# Patient Record
Sex: Male | Born: 1965 | Race: White | Hispanic: No | Marital: Married | Smoking: Never smoker
Health system: Southern US, Community
[De-identification: ages and names within clinical notes are randomized; demographics above are authoritative.]

## PROBLEM LIST (undated history)

## (undated) DIAGNOSIS — N200 Calculus of kidney: Secondary | ICD-10-CM

## (undated) DIAGNOSIS — E785 Hyperlipidemia, unspecified: Secondary | ICD-10-CM

## (undated) HISTORY — PX: KNEE SURGERY: SHX244

## (undated) HISTORY — PX: KIDNEY STONE SURGERY: SHX686

## (undated) HISTORY — PX: COLONOSCOPY: SHX174

---

## 2016-09-02 DIAGNOSIS — Z85828 Personal history of other malignant neoplasm of skin: Secondary | ICD-10-CM | POA: Diagnosis not present

## 2016-09-02 DIAGNOSIS — L820 Inflamed seborrheic keratosis: Secondary | ICD-10-CM | POA: Diagnosis not present

## 2016-11-02 DIAGNOSIS — J0140 Acute pansinusitis, unspecified: Secondary | ICD-10-CM | POA: Diagnosis not present

## 2016-11-16 DIAGNOSIS — M25561 Pain in right knee: Secondary | ICD-10-CM | POA: Diagnosis not present

## 2016-11-16 DIAGNOSIS — M2391 Unspecified internal derangement of right knee: Secondary | ICD-10-CM | POA: Diagnosis not present

## 2017-03-02 DIAGNOSIS — M2391 Unspecified internal derangement of right knee: Secondary | ICD-10-CM | POA: Diagnosis not present

## 2017-03-22 DIAGNOSIS — M545 Low back pain: Secondary | ICD-10-CM | POA: Diagnosis not present

## 2017-03-22 DIAGNOSIS — Z0000 Encounter for general adult medical examination without abnormal findings: Secondary | ICD-10-CM | POA: Diagnosis not present

## 2017-03-22 DIAGNOSIS — E7800 Pure hypercholesterolemia, unspecified: Secondary | ICD-10-CM | POA: Diagnosis not present

## 2017-03-28 DIAGNOSIS — E7800 Pure hypercholesterolemia, unspecified: Secondary | ICD-10-CM | POA: Diagnosis not present

## 2017-03-28 DIAGNOSIS — Z1211 Encounter for screening for malignant neoplasm of colon: Secondary | ICD-10-CM | POA: Diagnosis not present

## 2017-03-28 DIAGNOSIS — Z0000 Encounter for general adult medical examination without abnormal findings: Secondary | ICD-10-CM | POA: Diagnosis not present

## 2017-04-06 DIAGNOSIS — L728 Other follicular cysts of the skin and subcutaneous tissue: Secondary | ICD-10-CM | POA: Diagnosis not present

## 2017-07-24 ENCOUNTER — Ambulatory Visit
Admission: EM | Admit: 2017-07-24 | Discharge: 2017-07-24 | Disposition: A | Discharge status: Home / Self Care | Payer: 59 | Attending: Family Medicine | Admitting: Family Medicine

## 2017-07-24 ENCOUNTER — Encounter: Payer: Self-pay | Admitting: Gynecology

## 2017-07-24 ENCOUNTER — Other Ambulatory Visit: Payer: Self-pay

## 2017-07-24 DIAGNOSIS — J069 Acute upper respiratory infection, unspecified: Secondary | ICD-10-CM

## 2017-07-24 DIAGNOSIS — R05 Cough: Secondary | ICD-10-CM | POA: Diagnosis not present

## 2017-07-24 DIAGNOSIS — B9789 Other viral agents as the cause of diseases classified elsewhere: Secondary | ICD-10-CM | POA: Diagnosis not present

## 2017-07-24 MED ORDER — HYDROCOD POLST-CPM POLST ER 10-8 MG/5ML PO SUER: 5.0000 mL | Freq: Two times a day (BID) | ORAL | 0 refills | Status: DC | PRN

## 2017-07-24 NOTE — ED Triage Notes (Signed)
Patient c/o sinus problem x 3 days.

## 2017-07-24 NOTE — ED Provider Notes (Signed)
[facility]    CSN: [id number] Arrival date & time: 07/24/17  1194     History   Chief Complaint Chief Complaint  Patient presents with  . Sinusitis    HPI James Mcdowell is a 51 y.o. male.   The history is provided by the patient.  URI  Presenting symptoms: congestion, cough and rhinorrhea   Severity:  Moderate Onset quality:  Sudden Duration:  3 days Timing:  Constant Progression:  Worsening Chronicity:  New Relieved by:  OTC medications Associated symptoms: sinus pain   Associated symptoms: no wheezing   Risk factors: sick contacts   Risk factors: not elderly, no chronic cardiac disease, no chronic kidney disease, no chronic respiratory disease, no diabetes mellitus, no immunosuppression, no recent illness and no recent travel     History reviewed. No pertinent past medical history.  There are no active problems to display for this patient.   Past Surgical History:  Procedure Laterality Date  . KNEE SURGERY Left        Home Medications    Prior to Admission medications   Medication Sig Start Date End Date Taking? Authorizing Provider  chlorpheniramine-HYDROcodone (TUSSIONEX PENNKINETIC ER) 10-8 MG/5ML SUER Take 5 mLs by mouth every 12 (twelve) hours as needed. 07/24/17   Norval Gable, MD    Family History Family History  Problem Relation Age of Onset  . Diabetes Father   . Heart failure Father   . Kidney failure Father     Social History Social History   Tobacco Use  . Smoking status: Never Smoker  . Smokeless tobacco: Never Used  Substance Use Topics  . Alcohol use: No    Frequency: Never  . Drug use: No     Allergies   Patient has no allergy information on record.   Review of Systems Review of Systems  HENT: Positive for congestion, rhinorrhea and sinus pain.   Respiratory: Positive for cough. Negative for wheezing.      Physical Exam Triage Vital Signs ED Triage Vitals  Enc Vitals Group     BP 07/24/17  0842 124/75     Pulse Rate 07/24/17 0842 69     Resp 07/24/17 0842 16     Temp 07/24/17 0842 98.3 F (36.8 C)     Temp Source 07/24/17 0842 Oral     SpO2 07/24/17 0842 98 %     Weight 07/24/17 0841 290 lb (131.5 kg)     Height 07/24/17 0841 6\' 3"  (1.905 m)     Head Circumference --      Peak Flow --      Pain Score 07/24/17 0844 2     Pain Loc --      Pain Edu? --      Excl. in [place]? --    No data found.  Updated Vital Signs BP 124/75 (BP Location: Left Arm)   Pulse 69   Temp 98.3 F (36.8 C) (Oral)   Resp 16   Ht 6\' 3"  (1.905 m)   Wt 290 lb (131.5 kg)   SpO2 98%   BMI 36.25 kg/m   Visual Acuity Right Eye Distance:   Left Eye Distance:   Bilateral Distance:    Right Eye Near:   Left Eye Near:    Bilateral Near:     Physical Exam  Constitutional: He appears well-developed and well-nourished. No distress.  HENT:  Head: Normocephalic and atraumatic.  Right Ear: Tympanic membrane, external ear and ear canal normal.  Left  Ear: Tympanic membrane, external ear and ear canal normal.  Nose: Nose normal.  Mouth/Throat: Uvula is midline, oropharynx is clear and moist and mucous membranes are normal. No oropharyngeal exudate or tonsillar abscesses.  Eyes: Conjunctivae and EOM are normal. Pupils are equal, round, and reactive to light. Right eye exhibits no discharge. Left eye exhibits no discharge. No scleral icterus.  Neck: Normal range of motion. Neck supple. No tracheal deviation present. No thyromegaly present.  Cardiovascular: Normal rate, regular rhythm and normal heart sounds.  Pulmonary/Chest: Effort normal and breath sounds normal. No stridor. No respiratory distress. He has no wheezes. He has no rales. He exhibits no tenderness.  Lymphadenopathy:    He has no cervical adenopathy.  Neurological: He is alert.  Skin: Skin is warm and dry. No rash noted. He is not diaphoretic.  Nursing note and vitals reviewed.    UC Treatments / Results  Labs (all labs ordered  are listed, but only abnormal results are displayed) Labs Reviewed - No data to display  EKG  EKG Interpretation None       Radiology No results found.  Procedures Procedures (including critical care time)  Medications Ordered in UC Medications - No data to display   Initial Impression / Assessment and Plan / UC Course  I have reviewed the triage vital signs and the nursing notes.  Pertinent labs & imaging results that were available during my care of the patient were reviewed by me and considered in my medical decision making (see chart for details).       Final Clinical Impressions(s) / UC Diagnoses   Final diagnoses:  Viral URI with cough    ED Discharge Orders        Ordered    chlorpheniramine-HYDROcodone (TUSSIONEX PENNKINETIC ER) 10-8 MG/5ML SUER  Every 12 hours PRN     07/24/17 0901     1. diagnosis reviewed with patient 2. rx as per orders above; reviewed possible side effects, interactions, risks and benefits  3. Recommend supportive treatment with rest, fluids, otc analgesics 4. Follow-up prn if symptoms worsen or don't improve  Controlled Substance Prescriptions [place] Controlled Substance Registry consulted? Not Applicable   Norval Gable, MD 07/24/17 [phone]

## 2017-08-11 DIAGNOSIS — J0140 Acute pansinusitis, unspecified: Secondary | ICD-10-CM | POA: Diagnosis not present

## 2017-09-01 DIAGNOSIS — Z85828 Personal history of other malignant neoplasm of skin: Secondary | ICD-10-CM | POA: Diagnosis not present

## 2017-09-01 DIAGNOSIS — L570 Actinic keratosis: Secondary | ICD-10-CM | POA: Diagnosis not present

## 2017-09-06 DIAGNOSIS — J309 Allergic rhinitis, unspecified: Secondary | ICD-10-CM | POA: Diagnosis not present

## 2017-10-13 DIAGNOSIS — Z01818 Encounter for other preprocedural examination: Secondary | ICD-10-CM | POA: Diagnosis not present

## 2017-10-13 DIAGNOSIS — Z1211 Encounter for screening for malignant neoplasm of colon: Secondary | ICD-10-CM | POA: Diagnosis not present

## 2017-11-14 DIAGNOSIS — J0140 Acute pansinusitis, unspecified: Secondary | ICD-10-CM | POA: Diagnosis not present

## 2017-11-14 DIAGNOSIS — J309 Allergic rhinitis, unspecified: Secondary | ICD-10-CM | POA: Diagnosis not present

## 2017-12-16 DIAGNOSIS — K635 Polyp of colon: Secondary | ICD-10-CM | POA: Diagnosis not present

## 2017-12-16 DIAGNOSIS — K648 Other hemorrhoids: Secondary | ICD-10-CM | POA: Diagnosis not present

## 2017-12-16 DIAGNOSIS — Z1211 Encounter for screening for malignant neoplasm of colon: Secondary | ICD-10-CM | POA: Diagnosis not present

## 2017-12-16 DIAGNOSIS — D126 Benign neoplasm of colon, unspecified: Secondary | ICD-10-CM | POA: Diagnosis not present

## 2017-12-16 DIAGNOSIS — K640 First degree hemorrhoids: Secondary | ICD-10-CM | POA: Diagnosis not present

## 2017-12-26 DIAGNOSIS — J324 Chronic pansinusitis: Secondary | ICD-10-CM | POA: Diagnosis not present

## 2017-12-26 DIAGNOSIS — J45991 Cough variant asthma: Secondary | ICD-10-CM | POA: Diagnosis not present

## 2018-01-10 DIAGNOSIS — J309 Allergic rhinitis, unspecified: Secondary | ICD-10-CM | POA: Diagnosis not present

## 2018-01-10 DIAGNOSIS — J320 Chronic maxillary sinusitis: Secondary | ICD-10-CM | POA: Diagnosis not present

## 2018-03-22 DIAGNOSIS — Z0000 Encounter for general adult medical examination without abnormal findings: Secondary | ICD-10-CM | POA: Diagnosis not present

## 2018-03-22 DIAGNOSIS — E7800 Pure hypercholesterolemia, unspecified: Secondary | ICD-10-CM | POA: Diagnosis not present

## 2018-03-22 DIAGNOSIS — Z1211 Encounter for screening for malignant neoplasm of colon: Secondary | ICD-10-CM | POA: Diagnosis not present

## 2018-03-29 ENCOUNTER — Other Ambulatory Visit: Payer: Self-pay | Admitting: Internal Medicine

## 2018-03-29 DIAGNOSIS — Z0000 Encounter for general adult medical examination without abnormal findings: Secondary | ICD-10-CM | POA: Diagnosis not present

## 2018-03-29 DIAGNOSIS — E7800 Pure hypercholesterolemia, unspecified: Secondary | ICD-10-CM | POA: Diagnosis not present

## 2018-03-29 DIAGNOSIS — R600 Localized edema: Secondary | ICD-10-CM

## 2018-03-30 ENCOUNTER — Ambulatory Visit
Admission: RE | Admit: 2018-03-30 | Discharge: 2018-03-30 | Disposition: A | Discharge status: Home / Self Care | Payer: 59 | Source: Ambulatory Visit | Attending: Internal Medicine | Admitting: Internal Medicine

## 2018-03-30 DIAGNOSIS — R600 Localized edema: Secondary | ICD-10-CM

## 2018-06-12 DIAGNOSIS — Z23 Encounter for immunization: Secondary | ICD-10-CM | POA: Diagnosis not present

## 2018-06-15 DIAGNOSIS — J019 Acute sinusitis, unspecified: Secondary | ICD-10-CM | POA: Diagnosis not present

## 2018-06-15 DIAGNOSIS — J4520 Mild intermittent asthma, uncomplicated: Secondary | ICD-10-CM | POA: Diagnosis not present

## 2018-06-15 DIAGNOSIS — J309 Allergic rhinitis, unspecified: Secondary | ICD-10-CM | POA: Diagnosis not present

## 2018-06-29 DIAGNOSIS — J320 Chronic maxillary sinusitis: Secondary | ICD-10-CM | POA: Diagnosis not present

## 2018-06-29 DIAGNOSIS — J3489 Other specified disorders of nose and nasal sinuses: Secondary | ICD-10-CM | POA: Diagnosis not present

## 2018-07-20 DIAGNOSIS — D2262 Melanocytic nevi of left upper limb, including shoulder: Secondary | ICD-10-CM | POA: Diagnosis not present

## 2018-07-20 DIAGNOSIS — Z85828 Personal history of other malignant neoplasm of skin: Secondary | ICD-10-CM | POA: Diagnosis not present

## 2018-07-20 DIAGNOSIS — X32XXXA Exposure to sunlight, initial encounter: Secondary | ICD-10-CM | POA: Diagnosis not present

## 2018-07-20 DIAGNOSIS — L570 Actinic keratosis: Secondary | ICD-10-CM | POA: Diagnosis not present

## 2018-07-20 DIAGNOSIS — D2261 Melanocytic nevi of right upper limb, including shoulder: Secondary | ICD-10-CM | POA: Diagnosis not present

## 2018-07-25 ENCOUNTER — Other Ambulatory Visit: Payer: Self-pay

## 2018-07-25 ENCOUNTER — Encounter: Payer: Self-pay | Admitting: *Deleted

## 2018-08-04 ENCOUNTER — Encounter
Admission: RE | Disposition: A | Discharge status: Home / Self Care | Payer: Self-pay | Source: Home / Self Care | Attending: Unknown Physician Specialty

## 2018-08-04 ENCOUNTER — Ambulatory Visit
Admission: RE | Admit: 2018-08-04 | Discharge: 2018-08-04 | Disposition: A | Discharge status: Home / Self Care | Payer: 59 | Attending: Unknown Physician Specialty | Admitting: Unknown Physician Specialty

## 2018-08-04 ENCOUNTER — Ambulatory Visit: Payer: 59 | Admitting: Anesthesiology

## 2018-08-04 DIAGNOSIS — J324 Chronic pansinusitis: Secondary | ICD-10-CM | POA: Diagnosis not present

## 2018-08-04 DIAGNOSIS — J342 Deviated nasal septum: Secondary | ICD-10-CM | POA: Insufficient documentation

## 2018-08-04 DIAGNOSIS — J320 Chronic maxillary sinusitis: Secondary | ICD-10-CM | POA: Diagnosis not present

## 2018-08-04 DIAGNOSIS — J3489 Other specified disorders of nose and nasal sinuses: Secondary | ICD-10-CM | POA: Insufficient documentation

## 2018-08-04 DIAGNOSIS — J343 Hypertrophy of nasal turbinates: Secondary | ICD-10-CM | POA: Diagnosis not present

## 2018-08-04 HISTORY — PX: IMAGE GUIDED SINUS SURGERY: SHX6570

## 2018-08-04 HISTORY — PX: SEPTOPLASTY: SHX2393

## 2018-08-04 HISTORY — DX: Calculus of kidney: N20.0

## 2018-08-04 HISTORY — DX: Hyperlipidemia, unspecified: E78.5

## 2018-08-04 HISTORY — PX: MAXILLARY ANTROSTOMY: SHX2003

## 2018-08-04 HISTORY — PX: TURBINATE REDUCTION: SHX6157

## 2018-08-04 SURGERY — SINUS SURGERY, WITH IMAGING GUIDANCE
Anesthesia: General

## 2018-08-04 MED ORDER — OXYCODONE HCL 5 MG/5ML PO SOLN: 5.0000 mg | Freq: Once | ORAL | Status: AC | PRN

## 2018-08-04 MED ORDER — OXYMETAZOLINE HCL 0.05 % NA SOLN
6.0000 | Freq: Once | NASAL | Status: AC
  Administered 2018-08-04: 6 via NASAL

## 2018-08-04 MED ORDER — MIDAZOLAM HCL 5 MG/5ML IJ SOLN
INTRAMUSCULAR | Status: DC | PRN
  Administered 2018-08-04: 2 mg via INTRAVENOUS

## 2018-08-04 MED ORDER — PHENYLEPHRINE HCL 0.5 % NA SOLN
NASAL | Status: DC | PRN
  Administered 2018-08-04: 15 mL via NASAL

## 2018-08-04 MED ORDER — PROPOFOL 10 MG/ML IV BOLUS
INTRAVENOUS | Status: DC | PRN
  Administered 2018-08-04: 200 mg via INTRAVENOUS

## 2018-08-04 MED ORDER — ACETAMINOPHEN 10 MG/ML IV SOLN
1000.0000 mg | Freq: Once | INTRAVENOUS | Status: AC
  Administered 2018-08-04: 1000 mg via INTRAVENOUS

## 2018-08-04 MED ORDER — LIDOCAINE-EPINEPHRINE 1 %-1:100000 IJ SOLN
INTRAMUSCULAR | Status: DC | PRN
  Administered 2018-08-04: 18 mL

## 2018-08-04 MED ORDER — SULFAMETHOXAZOLE-TRIMETHOPRIM 800-160 MG PO TABS: 1.0000 | ORAL_TABLET | Freq: Two times a day (BID) | ORAL | 0 refills | Status: AC

## 2018-08-04 MED ORDER — SCOPOLAMINE 1 MG/3DAYS TD PT72
1.0000 | MEDICATED_PATCH | Freq: Once | TRANSDERMAL | Status: DC
  Administered 2018-08-04: 1.5 mg via TRANSDERMAL

## 2018-08-04 MED ORDER — FENTANYL CITRATE (PF) 100 MCG/2ML IJ SOLN: 25.0000 ug | INTRAMUSCULAR | Status: DC | PRN

## 2018-08-04 MED ORDER — LIDOCAINE HCL (CARDIAC) PF 100 MG/5ML IV SOSY
PREFILLED_SYRINGE | INTRAVENOUS | Status: DC | PRN
  Administered 2018-08-04: 40 mg via INTRAVENOUS

## 2018-08-04 MED ORDER — ONDANSETRON HCL 4 MG/2ML IJ SOLN
INTRAMUSCULAR | Status: DC | PRN
  Administered 2018-08-04: 4 mg via INTRAVENOUS

## 2018-08-04 MED ORDER — OXYCODONE HCL 5 MG PO TABS
5.0000 mg | ORAL_TABLET | Freq: Once | ORAL | Status: AC | PRN
  Administered 2018-08-04: 5 mg via ORAL

## 2018-08-04 MED ORDER — FENTANYL CITRATE (PF) 100 MCG/2ML IJ SOLN
INTRAMUSCULAR | Status: DC | PRN
  Administered 2018-08-04: 50 ug via INTRAVENOUS
  Administered 2018-08-04 (×2): 25 ug via INTRAVENOUS

## 2018-08-04 MED ORDER — HYDROCODONE-ACETAMINOPHEN 5-300 MG PO TABS: 1.0000 | ORAL_TABLET | ORAL | 0 refills | Status: AC | PRN

## 2018-08-04 MED ORDER — LACTATED RINGERS IV SOLN
INTRAVENOUS | Status: DC
  Administered 2018-08-04: 11:00:00 via INTRAVENOUS

## 2018-08-04 MED ORDER — GLYCOPYRROLATE 0.2 MG/ML IJ SOLN
INTRAMUSCULAR | Status: DC | PRN
  Administered 2018-08-04: 0.1 mg via INTRAVENOUS

## 2018-08-04 MED ORDER — SUCCINYLCHOLINE CHLORIDE 20 MG/ML IJ SOLN
INTRAMUSCULAR | Status: DC | PRN
  Administered 2018-08-04: 100 mg via INTRAVENOUS

## 2018-08-04 MED ORDER — ONDANSETRON HCL 4 MG/2ML IJ SOLN: 4.0000 mg | Freq: Once | INTRAMUSCULAR | Status: DC | PRN

## 2018-08-04 MED ORDER — DEXAMETHASONE SODIUM PHOSPHATE 4 MG/ML IJ SOLN
INTRAMUSCULAR | Status: DC | PRN
  Administered 2018-08-04: 10 mg via INTRAVENOUS

## 2018-08-04 SURGICAL SUPPLY — 33 items
BATTERY INSTRU NAVIGATION (MISCELLANEOUS) ×9 IMPLANT
CANISTER SUCT 1200ML W/VALVE (MISCELLANEOUS) ×3 IMPLANT
COAG SUCT 10F 3.5MM HAND CTRL (MISCELLANEOUS) ×3 IMPLANT
CUP MEDICINE 2OZ PLAST GRAD ST (MISCELLANEOUS) ×3 IMPLANT
DRAPE HEAD BAR (DRAPES) ×3 IMPLANT
DRESSING NASL FOAM PST OP SINU (MISCELLANEOUS) ×4 IMPLANT
DRSG NASAL FOAM POST OP SINU (MISCELLANEOUS) ×6
ELECT REM PT RETURN 9FT ADLT (ELECTROSURGICAL) ×3
ELECTRODE REM PT RTRN 9FT ADLT (ELECTROSURGICAL) ×2 IMPLANT
GLOVE BIO SURGEON STRL SZ7.5 (GLOVE) ×6 IMPLANT
HANDLE YANKAUER SUCT BULB TIP (MISCELLANEOUS) ×3 IMPLANT
KIT TURNOVER KIT A (KITS) ×3 IMPLANT
NEEDLE HYPO 25GX1X1/2 BEV (NEEDLE) ×3 IMPLANT
NS IRRIG 500ML POUR BTL (IV SOLUTION) ×3 IMPLANT
PACK ENT CUSTOM (PACKS) ×3 IMPLANT
SOL ANTI-FOG 6CC FOG-OUT (MISCELLANEOUS) ×2 IMPLANT
SOL FOG-OUT ANTI-FOG 6CC (MISCELLANEOUS) ×1
SPLINT NASAL SEPTAL BLV .25 LG (MISCELLANEOUS) ×3 IMPLANT
SPONGE NEURO XRAY DETECT 1X3 (DISPOSABLE) ×3 IMPLANT
STRAP BODY AND KNEE 60X3 (MISCELLANEOUS) ×3 IMPLANT
SUT CHROMIC 3-0 (SUTURE) ×1
SUT CHROMIC 3-0 KS 27XMFL CR (SUTURE) ×2
SUT CHROMIC 5-0 (SUTURE)
SUT CHROMIC 5-0 P2 18XMFL CR (SUTURE)
SUT ETHILON 3-0 KS 30 BLK (SUTURE) ×3 IMPLANT
SUT PLAIN GUT 4-0 (SUTURE) IMPLANT
SUTURE CHRMC 3-0 KS 27XMFL CR (SUTURE) ×2 IMPLANT
SUTURE CHRMC 5-0 P2 18XMF CR (SUTURE) IMPLANT
SYR 10ML LL (SYRINGE) ×3 IMPLANT
TOWEL OR 17X26 4PK STRL BLUE (TOWEL DISPOSABLE) ×3 IMPLANT
TRACKER CRANIALMASK (MASK) ×3 IMPLANT
TUBING CONNECTING 10 (TUBING) ×3 IMPLANT
WATER STERILE IRR 250ML POUR (IV SOLUTION) ×3 IMPLANT

## 2018-08-04 NOTE — Discharge Instructions (Signed)
[facility] °[facility] °ENDOSCOPIC SINUS SURGERY °[facility] ° °What is Functional Endoscopic Sinus Surgery? ° The Surgery involves making the natural openings of the sinuses larger by removing the bony partitions that separate the sinuses from the nasal cavity.  The natural sinus lining is preserved as much as possible to allow the sinuses to resume normal function after the surgery.  In some patients nasal polyps (excessively swollen lining of the sinuses) may be removed to relieve obstruction of the sinus openings.  The surgery is performed through the nose using lighted scopes, which eliminates the need for incisions on the face.  A septoplasty is a different procedure which is sometimes performed with sinus surgery.  It involves straightening the boy partition that separates the two sides of your nose.  A crooked or deviated septum may need repair if is obstructing the sinuses or nasal airflow.  Turbinate reduction is also often performed during sinus surgery.  The turbinates are bony proturberances from the side walls of the nose which swell and can obstruct the nose in patients with sinus and allergy problems.  Their size can be surgically reduced to help relieve nasal obstruction. ° °What Can Sinus Surgery Do For Me? ° Sinus surgery can reduce the frequency of sinus infections requiring antibiotic treatment.  This can provide improvement in nasal congestion, post-nasal drainage, facial pressure and nasal obstruction.  Surgery will NOT prevent you from ever having an infection again, so it usually only for patients who get infections 4 or more times yearly requiring antibiotics, or for infections that do not clear with antibiotics.  It will not cure nasal allergies, so patients with allergies may still require medication to treat their allergies after surgery. Surgery may improve headaches related to sinusitis, however, some people will continue to  require medication to control sinus headaches related to allergies.  Surgery will do nothing for other forms of headache (migraine, tension or cluster). ° °What Are the Risks of Endoscopic Sinus Surgery? ° Current techniques allow surgery to be performed safely with little risk, however, there are rare complications that patients should be aware of.  Because the sinuses are located around the eyes, there is risk of eye injury, including blindness, though again, this would be quite rare. This is usually a result of bleeding behind the eye during surgery, which puts the vision oat risk, though there are treatments to protect the vision and prevent permanent disrupted by surgery causing a leak of the spinal fluid that surrounds the brain.  More serious complications would include bleeding inside the brain cavity or damage to the brain.  Again, all of these complications are uncommon, and spinal fluid leaks can be safely managed surgically if they occur.  The most common complication of sinus surgery is bleeding from the nose, which may require packing or cauterization of the nose.  Continued sinus have polyps may experience recurrence of the polyps requiring revision surgery.  Alterations of sense of smell or injury to the tear ducts are also rare complications.  ° °What is the Surgery Like, and what is the Recovery? ° The Surgery usually takes a couple of hours to perform, and is usually performed under a general anesthetic (completely asleep).  Patients are usually discharged home after a couple of hours.  Sometimes during surgery it is necessary to pack the nose to control bleeding, and the packing is left in place for 24 - 48 hours, and removed by your surgeon.    If a septoplasty was performed during the procedure, there is often a splint placed which must be removed after 5-7 days.   °Discomfort: Pain is usually mild to moderate, and can be controlled by prescription pain medication or acetaminophen (Tylenol).   Aspirin, Ibuprofen (Advil, Motrin), or Naprosyn (Aleve) should be avoided, as they can cause increased bleeding.  Most patients feel sinus pressure like they have a bad head cold for several days.  Sleeping with your head elevated can help reduce swelling and facial pressure, as can ice packs over the face.  A humidifier may be helpful to keep the mucous and blood from drying in the nose.  ° °Diet: There are no specific diet restrictions, however, you should generally start with clear liquids and a light diet of bland foods because the anesthetic can cause some nausea.  Advance your diet depending on how your stomach feels.  Taking your pain medication with food will often help reduce stomach upset which pain medications can cause. ° °Nasal Saline Irrigation: It is important to remove blood clots and dried mucous from the nose as it is healing.  This is done by having you irrigate the nose at least 3 - 4 times daily with a salt water solution.  We recommend using NeilMed Sinus Rinse (available at the drug store).  Fill the squeeze bottle with the solution, bend over a sink, and insert the tip of the squeeze bottle into the nose ½ of an inch.  Point the tip of the squeeze bottle towards the inside corner of the eye on the same side your irrigating.  Squeeze the bottle and gently irrigate the nose.  If you bend forward as you do this, most of the fluid will flow back out of the nose, instead of down your throat.   The solution should be warm, near body temperature, when you irrigate.   Each time you irrigate, you should use a full squeeze bottle.  ° °Note that if you are instructed to use Nasal Steroid Sprays at any time after your surgery, irrigate with saline BEFORE using the steroid spray, so you do not wash it all out of the nose. °Another product, Nasal Saline Gel (such as AYR Nasal Saline Gel) can be applied in each nostril 3 - 4 times daily to moisture the nose and reduce scabbing or crusting. ° °Bleeding:   Bloody drainage from the nose can be expected for several days, and patients are instructed to irrigate their nose frequently with salt water to help remove mucous and blood clots.  The drainage may be dark red or brown, though some fresh blood may be seen intermittently, especially after irrigation.  Do not blow you nose, as bleeding may occur. If you must sneeze, keep your mouth open to allow air to escape through your mouth. ° °If heavy bleeding occurs: Irrigate the nose with saline to rinse out clots, then spray the nose 3 - 4 times with Afrin Nasal Decongestant Spray.  The spray will constrict the blood vessels to slow bleeding.  Pinch the lower half of your nose shut to apply pressure, and lay down with your head elevated.  Ice packs over the nose may help as well. If bleeding persists despite these measures, you should notify your doctor.  Do not use the Afrin routinely to control nasal congestion after surgery, as it can result in worsening congestion and may affect healing.  ° ° ° °Activity: Return to work varies among patients. Most patients will be   work at least 5 - 7 days to recover.  Patient may return to work after they are off of narcotic pain medication, and feeling well enough to perform the functions of their job.  Patients must avoid heavy lifting (over 10 pounds) or strenuous physical for 2 weeks after surgery, so your employer may need to assign you to light duty, or keep you out of work longer if light duty is not possible.  NOTE: you should not drive, operate dangerous machinery, do any mentally demanding tasks or make any important legal or financial decisions while on narcotic pain medication and recovering from the general anesthetic.  °  °Call Your Doctor Immediately if You Have Any of the Following: °1. Bleeding that you cannot control with the above measures °2. Loss of vision, double vision, bulging of the eye or black eyes. °3. Fever over 101 degrees °4. Neck stiffness with severe  headache, fever, nausea and change in mental state. °You are always encourage to call anytime with concerns, however, please call with requests for pain medication refills during office hours. ° °Office Endoscopy: During follow-up visits your doctor will remove any packing or splints that may have been placed and evaluate and clean your sinuses endoscopically.  Topical anesthetic will be used to make this as comfortable as possible, though you may want to take your pain medication prior to the visit.  How often this will need to be done varies from patient to patient.  After complete recovery from the surgery, you may need follow-up endoscopy from time to time, particularly if there is concern of recurrent infection or nasal polyps. ° °Scopolamine skin patches °REMOVE PATCH IN 72 HOURS AND WASH HANDS IMMEDIATELY °What is this medicine? °SCOPOLAMINE (skoe POL a meen) is used to prevent nausea and vomiting caused by motion sickness, anesthesia and surgery. °This medicine may be used for other purposes; ask your health care provider or pharmacist if you have questions. °COMMON BRAND NAME(S): Transderm Scop °What should I tell my health care provider before I take this medicine? °They need to know if you have any of these conditions: °-are scheduled to have a gastric secretion test °-glaucoma °-heart disease °-kidney disease °-liver disease °-lung or breathing disease, like asthma °-mental illness °-prostate disease °-seizures °-stomach or intestine problems °-trouble passing urine °-an unusual or allergic reaction to scopolamine, atropine, other medicines, foods, dyes, or preservatives °-pregnant or trying to get pregnant °-breast-feeding °How should I use this medicine? °This medicine is for external use only. Follow the directions on the prescription label. Wear only 1 patch at a time. Choose an area behind the ear, that is clean, dry, hairless and free from any cuts or irritation. Wipe the area with a clean dry  tissue. Peel off the plastic backing of the skin patch, trying not to touch the adhesive side with your hands. Do not cut the patches. Firmly apply to the area you have chosen, with the metallic side of the patch to the skin and the tan-colored side showing. Once firmly in place, wash your hands well with soap and water. Do not get this medicine into your eyes. °After removing the patch, wash your hands and the area behind your ear thoroughly with soap and water. The patch will still contain some medicine after use. To avoid accidental contact or ingestion by children or pets, fold the used patch in half with the sticky side together and throw away in the trash out of the reach of children and pets. If   you need to use a second patch after you remove the first, place it behind the other ear. °A special MedGuide will be given to you by the pharmacist with each prescription and refill. Be sure to read this information carefully each time. °Talk to your pediatrician regarding the use of this medicine in children. Special care may be needed. °Overdosage: If you think you have taken too much of this medicine contact a poison control center or emergency room at once. °NOTE: This medicine is only for you. Do not share this medicine with others. °What if I miss a dose? °This does not apply. This medicine is not for regular use. °What may interact with this medicine? °-alcohol °-antihistamines for allergy cough and cold °-atropine °-certain medicines for anxiety or sleep °-certain medicines for bladder problems like oxybutynin, tolterodine °-certain medicines for depression like amitriptyline, fluoxetine, sertraline °-certain medicines for stomach problems like dicyclomine, hyoscyamine °-certain medicines for Parkinson's disease like benztropine, trihexyphenidyl °-certain medicines for seizures like phenobarbital, primidone °-general anesthetics like halothane, isoflurane, methoxyflurane, propofol °-ipratropium °-local  anesthetics like lidocaine, pramoxine, tetracaine °-medicines that relax muscles for surgery °-phenothiazines like chlorpromazine, mesoridazine, prochlorperazine, thioridazine °-narcotic medicines for pain °-other belladonna alkaloids °This list may not describe all possible interactions. Give your health care provider a list of all the medicines, herbs, non-prescription drugs, or dietary supplements you use. Also tell them if you smoke, drink alcohol, or use illegal drugs. Some items may interact with your medicine. °What should I watch for while using this medicine? °Limit contact with water while swimming and bathing because the patch may fall off. If the patch falls off, throw it away and put a new one behind the other ear. °You may get drowsy or dizzy. Do not drive, use machinery, or do anything that needs mental alertness until you know how this medicine affects you. Do not stand or sit up quickly, especially if you are an older patient. This reduces the risk of dizzy or fainting spells. Alcohol may interfere with the effect of this medicine. Avoid alcoholic drinks. °Your mouth may get dry. Chewing sugarless gum or sucking hard candy, and drinking plenty of water may help. Contact your healthcare professional if the problem does not go away or is severe. °This medicine may cause dry eyes and blurred vision. If you wear contact lenses, you may feel some discomfort. Lubricating drops may help. See your healthcare professional if the problem does not go away or is severe. °If you are going to need surgery, an MRI, CT scan, or other procedure, tell your healthcare professional that you are using this medicine. You may need to remove the patch before the procedure. °What side effects may I notice from receiving this medicine? °Side effects that you should report to your doctor or health care professional as soon as possible: °-allergic reactions like skin rash, itching or hives; swelling of the face, lips, or  tongue °-blurred vision °-changes in vision °-confusion °-dizziness °-eye pain °-fast, irregular heartbeat °-hallucinations, loss of contact with reality °-nausea, vomiting °-pain or trouble passing urine °-restlessness °-seizures °-skin irritation °-stomach pain °Side effects that usually do not require medical attention (report to your doctor or health care professional if they continue or are bothersome): °-drowsiness °-dry mouth °-headache °-sore throat °This list may not describe all possible side effects. Call your doctor for medical advice about side effects. You may report side effects to FDA at 1-800-FDA-1088. °Where should I keep my medicine? °Keep out of the reach of children. °  Store at room temperature between 20 and 25 degrees C (68 and 77 degrees F). Keep this medicine in the foil package until ready to use. Throw away any unused medicine after the expiration date. °NOTE: This sheet is a summary. It may not cover all possible information. If you have questions about this medicine, talk to your doctor, pharmacist, or health care provider. °© 2019 Elsevier/[employer] Standard (2017-10-14 16:14:46) ° ° °General Anesthesia, Adult, Care After °This sheet gives you information about how to care for yourself after your procedure. Your health care provider may also give you more specific instructions. If you have problems or questions, contact your health care provider. °What can I expect after the procedure? °After the procedure, the following side effects are common: °· Pain or discomfort at the IV site. °· Nausea. °· Vomiting. °· Sore throat. °· Trouble concentrating. °· Feeling cold or chills. °· Weak or tired. °· Sleepiness and fatigue. °· Soreness and body aches. These side effects can affect parts of the body that were not involved in surgery. °Follow these instructions at home: ° °For at least 24 hours after the procedure: °· Have a responsible adult stay with you. It is important to have someone help care  for you until you are awake and alert. °· Rest as needed. °· Do not: °? Participate in activities in which you could fall or become injured. °? Drive. °? Use heavy machinery. °? Drink alcohol. °? Take sleeping pills or medicines that cause drowsiness. °? Make important decisions or sign legal documents. °? Take care of children on your own. °Eating and drinking °· Follow any instructions from your health care provider about eating or drinking restrictions. °· When you feel hungry, start by eating small amounts of foods that are soft and easy to digest (bland), such as toast. Gradually return to your regular diet. °· Drink enough fluid to keep your urine pale yellow. °· If you vomit, rehydrate by drinking water, juice, or clear broth. °General instructions °· If you have sleep apnea, surgery and certain medicines can increase your risk for breathing problems. Follow instructions from your health care provider about wearing your sleep device: °? Anytime you are sleeping, including during daytime naps. °? While taking prescription pain medicines, sleeping medicines, or medicines that make you drowsy. °· Return to your normal activities as told by your health care provider. Ask your health care provider what activities are safe for you. °· Take over-the-counter and prescription medicines only as told by your health care provider. °· If you smoke, do not smoke without supervision. °· Keep all follow-up visits as told by your health care provider. This is important. °Contact a health care provider if: °· You have nausea or vomiting that does not get better with medicine. °· You cannot eat or drink without vomiting. °· You have pain that does not get better with medicine. °· You are unable to pass urine. °· You develop a skin rash. °· You have a fever. °· You have redness around your IV site that gets worse. °Get help right away if: °· You have difficulty breathing. °· You have chest pain. °· You have blood in your urine  or stool, or you vomit blood. °Summary °· After the procedure, it is common to have a sore throat or nausea. It is also common to feel tired. °· Have a responsible adult stay with you for the first 24 hours after general anesthesia. It is important to have someone help care for you   until you are awake and alert. °· When you feel hungry, start by eating small amounts of foods that are soft and easy to digest (bland), such as toast. Gradually return to your regular diet. °· Drink enough fluid to keep your urine pale yellow. °· Return to your normal activities as told by your health care provider. Ask your health care provider what activities are safe for you. °This information is not intended to replace advice given to you by your health care provider. Make sure you discuss any questions you have with your health care provider. °Document Released: 11/01/2000 Document Revised: 03/11/2017 Document Reviewed: 03/11/2017 °Elsevier Interactive Patient Education © 2019 [employer] ° °

## 2018-08-04 NOTE — Anesthesia Postprocedure Evaluation (Signed)
Anesthesia Post Note  Patient: James Mcdowell  Procedure(s) Performed: IMAGE GUIDED SINUS SURGERY (N/A ) SEPTOPLASTY (Bilateral ) BILATERAL SUBMUCOSAL TURBINATE REDUCTION (Bilateral ) MAXILLARY ANTROSTOMY WITH TISSUE REMOVAL (Bilateral )  Patient location during evaluation: PACU Anesthesia Type: General Level of consciousness: awake and alert and oriented Pain management: satisfactory to patient Vital Signs Assessment: post-procedure vital signs reviewed and stable Respiratory status: spontaneous breathing, nonlabored ventilation and respiratory function stable Cardiovascular status: blood pressure returned to baseline and stable Postop Assessment: Adequate PO intake and No signs of nausea or vomiting Anesthetic complications: no    Raliegh Ip

## 2018-08-04 NOTE — H&P (Signed)
The patient's history has been reviewed, patient examined, no change in status, stable for surgery.  Questions were answered to the patients satisfaction.  

## 2018-08-04 NOTE — Anesthesia Preprocedure Evaluation (Addendum)
Anesthesia Evaluation  Patient identified by MRN, date of birth, ID band Patient awake    Reviewed: Allergy & Precautions, H&P , NPO status , Patient's Chart, lab work & pertinent test results  Airway Mallampati: II  TM Distance: >3 FB Neck ROM: full    Dental no notable dental hx.    Pulmonary    Pulmonary exam normal breath sounds clear to auscultation       Cardiovascular Normal cardiovascular exam Rhythm:regular Rate:Normal     Neuro/Psych    GI/Hepatic   Endo/Other    Renal/GU      Musculoskeletal   Abdominal   Peds  Hematology   Anesthesia Other Findings   Reproductive/Obstetrics                             Anesthesia Physical Anesthesia Plan  ASA: II  Anesthesia Plan: General ETT   Post-op Pain Management:    Induction:   PONV Risk Score and Plan: 2 and Ondansetron, Dexamethasone, Scopolamine patch - Pre-op and Treatment may vary due to age or medical condition  Airway Management Planned:   Additional Equipment:   Intra-op Plan:   Post-operative Plan:   Informed Consent: I have reviewed the patients History and Physical, chart, labs and discussed the procedure including the risks, benefits and alternatives for the proposed anesthesia with the patient or authorized representative who has indicated his/her understanding and acceptance.     Plan Discussed with: CRNA  Anesthesia Plan Comments: (Probable OSA, Obesity)       Anesthesia Quick Evaluation

## 2018-08-04 NOTE — Transfer of Care (Signed)
Immediate Anesthesia Transfer of Care Note  Patient: James Mcdowell  Procedure(s) Performed: IMAGE GUIDED SINUS SURGERY (N/A ) SEPTOPLASTY (Bilateral ) BILATERAL SUBMUCOSAL TURBINATE REDUCTION (Bilateral ) MAXILLARY ANTROSTOMY WITH TISSUE REMOVAL (Bilateral )  Patient Location: PACU  Anesthesia Type: General ETT  Level of Consciousness: awake, alert  and patient cooperative  Airway and Oxygen Therapy: Patient Spontanous Breathing and Patient connected to supplemental oxygen  Post-op Assessment: Post-op Vital signs reviewed, Patient's Cardiovascular Status Stable, Respiratory Function Stable, Patent Airway and No signs of Nausea or vomiting  Post-op Vital Signs: Reviewed and stable  Complications: No apparent anesthesia complications

## 2018-08-04 NOTE — Op Note (Signed)
PREOPERATIVE DIAGNOSIS:  Chronic nasal obstruction.  Chronic bilateral maxillary sinusitis  POSTOPERATIVE DIAGNOSIS:  Chronic nasal obstruction.  SURGEON:  Roena Malady, M.D.  NAME OF PROCEDURE:  1. Nasal septoplasty. 2. Submucous resection of inferior turbinates. 3. Use of Stryker navigation 4. Bilateral endoscopic maxillary antrostomy with removal of tissue  OPERATIVE FINDINGS:  Severe nasal septal deformity, hypertrophy of the inferior turbinates.  Chronic changes maxillary sinuses  DESCRIPTION OF THE PROCEDURE:  James Mcdowell was identified in the holding area and taken to the operating room and placed in the supine position.  After general endotracheal anesthesia was induced, the table was turned 45 degrees and the patient was placed in a semi-Fowler position.  The nose was then topically anesthetized with Lidocaine, cotton pledgets were placed within each nostril. After approximately 5 minutes, this was removed at which time a local anesthetic of 1% Lidocaine 1:100,000 units of Epinephrine was used to inject the inferior turbinates in the nasal septum. A total of 66ml ml was used. Examination of the nose showed a severe left nasal septal deformity and tremendous hypertrophied inferior turbinate.  Beginning on the right hand side a hemitransfixion incision was then created on the leading edge of the septum on the right.  A subperichondrial plane was elevated posteriorly on the left and taken back to the perpendicular plate of the ethmoid where subperiosteal plane was elevated posteriorly on the left.   An inferior rim of cartilage was removed anteriorly with care taken to leave an anterior strut to prevent nasal collapse. With this strut removed the perpendicular plate of the ethmoid was separated from the quadrangular cartilage. The septal spur was removed.  The septum was then replaced in the midline. Reinspection through each nostril showed excellent reduction of the septal deformity. A  left posterior inferior fenestration was then created to allow hematoma drainage.  With the septoplasty completed, the operation then turned to the endoscopic portion.  The Stryker navigation device been applied and was calibrated and used throughout the sinus portion.  Beginning on the left-hand side the middle turbinate was gently medialized.  The 0 degree endoscope was introduced into this region the uncinate process was identified both with site and with the Stryker navigation.  The auto elevator was used to take down the uncinate process and the maxillary sinus was entered.  There was thickened mucosa which was removed.  The straight side-biting forceps were then used to open the antrostomy widely.  In similar fashion the right side was examined the middle turbinate was gently medialized the uncinate process was identified and taken down using the caudal elevator.  The straight and side-biting forceps were used to open the antrostomy widely removing the chronically inflamed tissue.  With both micro-antrostomies open widely the operation then turned to the submucous section of the inferior turbinates. Beginning on the left-hand side, a 15 blade was used to incise along the inferior edge of the inferior turbinate. A superior laterally based flap was then elevated. The underlying conchal bone of mucosa was excised using Knight scissors. The flap was then laid back over the turbinate stump and cauterized using suction cautery. In a similar fashion the submucous resection was performed on the right.  With the submucous resection completed bilaterally and no active bleeding, the hemitransfixion incision was then closed using two interrupted 3-0 chromic sutures.  Plastic nasal septal splints were placed within each nostril and affixed to the septum using a 3-0 nylon suture. Stammberger was then used beneath each inferior turbinate  for hemostasis.    The patient tolerated the procedure well, was returned to  anesthesia, extubated in the operating room, and taken to the recovery room in stable condition.    CULTURES:  None.  SPECIMENS:  Maxillary sinus contents  ESTIMATED BLOOD LOSS:  25 cc.  Roena Malady  08/04/2018  2:53 PM

## 2018-08-04 NOTE — Anesthesia Procedure Notes (Addendum)
Procedure Name: Intubation Date/Time: 08/04/2018 2:01 PM Performed by: Mayme Genta, CRNA Pre-anesthesia Checklist: Patient identified, Emergency Drugs available, Suction available, Patient being monitored and Timeout performed Patient Re-evaluated:Patient Re-evaluated prior to induction Oxygen Delivery Method: Circle system utilized Preoxygenation: Pre-oxygenation with 100% oxygen Induction Type: IV induction Ventilation: Mask ventilation without difficulty Laryngoscope Size: Miller and 3 Grade View: Grade I Tube type: Oral Rae Tube size: 8.0 mm Number of attempts: 1 Airway Equipment and Method: Bougie stylet Placement Confirmation: ETT inserted through vocal cords under direct vision,  positive ETCO2 and breath sounds checked- equal and bilateral Tube secured with: Tape Dental Injury: Teeth and Oropharynx as per pre-operative assessment

## 2018-08-07 ENCOUNTER — Encounter: Payer: Self-pay | Admitting: Unknown Physician Specialty

## 2018-08-07 DIAGNOSIS — J320 Chronic maxillary sinusitis: Secondary | ICD-10-CM | POA: Diagnosis not present

## 2018-08-08 LAB — SURGICAL PATHOLOGY

## 2018-08-28 DIAGNOSIS — J342 Deviated nasal septum: Secondary | ICD-10-CM | POA: Diagnosis not present

## 2018-08-28 DIAGNOSIS — J320 Chronic maxillary sinusitis: Secondary | ICD-10-CM | POA: Diagnosis not present

## 2018-09-13 DIAGNOSIS — J342 Deviated nasal septum: Secondary | ICD-10-CM | POA: Diagnosis not present

## 2018-09-13 DIAGNOSIS — J320 Chronic maxillary sinusitis: Secondary | ICD-10-CM | POA: Diagnosis not present

## 2018-12-15 DIAGNOSIS — Z6838 Body mass index (BMI) 38.0-38.9, adult: Secondary | ICD-10-CM | POA: Diagnosis not present

## 2018-12-15 DIAGNOSIS — Z0000 Encounter for general adult medical examination without abnormal findings: Secondary | ICD-10-CM | POA: Diagnosis not present

## 2018-12-15 DIAGNOSIS — E7800 Pure hypercholesterolemia, unspecified: Secondary | ICD-10-CM | POA: Diagnosis not present

## 2018-12-26 DIAGNOSIS — R5381 Other malaise: Secondary | ICD-10-CM | POA: Diagnosis not present

## 2018-12-26 DIAGNOSIS — N529 Male erectile dysfunction, unspecified: Secondary | ICD-10-CM | POA: Diagnosis not present

## 2019-09-05 ENCOUNTER — Ambulatory Visit: Payer: 59 | Attending: Internal Medicine

## 2019-09-05 DIAGNOSIS — Z20822 Contact with and (suspected) exposure to covid-19: Secondary | ICD-10-CM

## 2019-09-06 ENCOUNTER — Telehealth: Payer: Self-pay | Admitting: *Deleted

## 2019-09-06 LAB — NOVEL CORONAVIRUS, NAA: SARS-CoV-2, NAA: DETECTED — AB

## 2019-09-06 NOTE — Telephone Encounter (Signed)
Patient calling for COVID test results, no results available at this time. Verbalized understanding.

## 2019-09-07 ENCOUNTER — Telehealth: Payer: Self-pay | Admitting: Infectious Diseases

## 2019-11-08 ENCOUNTER — Ambulatory Visit: Payer: 59

## 2019-12-11 NOTE — Telephone Encounter (Signed)
Error

## 2020-02-12 IMAGING — US US EXTREM LOW VENOUS BILAT
1 series · 14 of 24 positions shown · non-contrast
Comparison: None.

CLINICAL DATA: Bilateral lower extremity edema

EXAM:
BILATERAL LOWER EXTREMITY VENOUS DUPLEX ULTRASOUND
TECHNIQUE: Doppler venous assessment of the bilateral lower extremity deep
venous system was performed, including characterization of spectral
flow, compressibility, and phasicity.

[Series 1: us extrem low venous bilat · 14 of 60 slices shown]
[im 1/60]
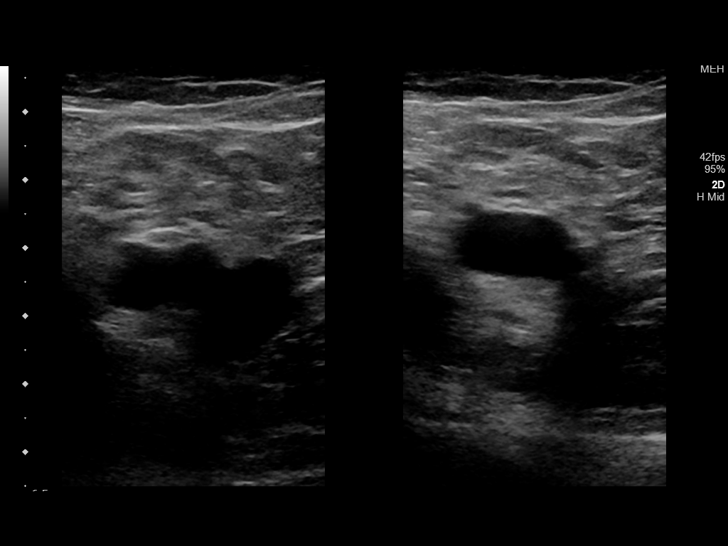
[im 6/60]
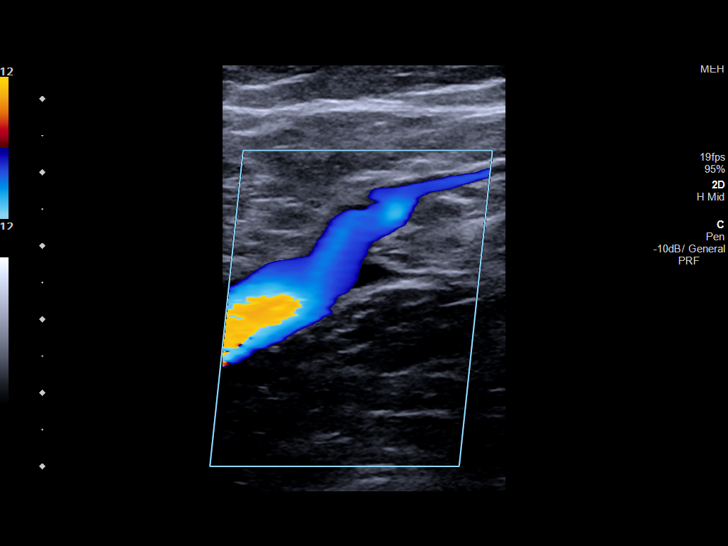
[im 11/60]
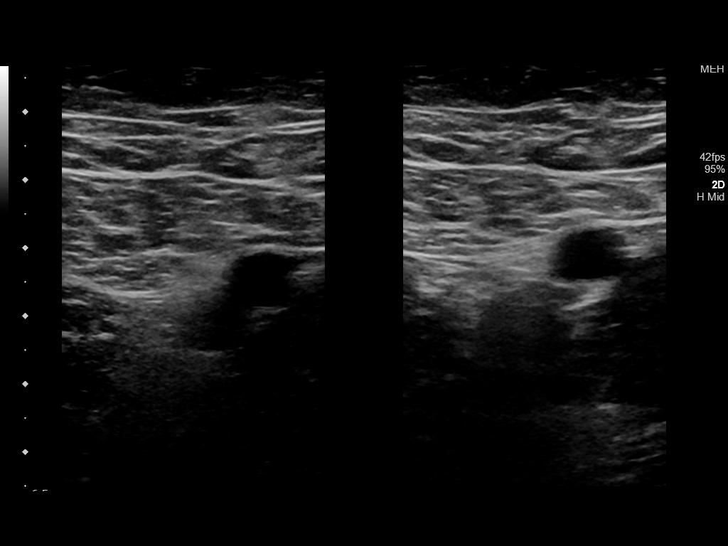
[im 16/60]
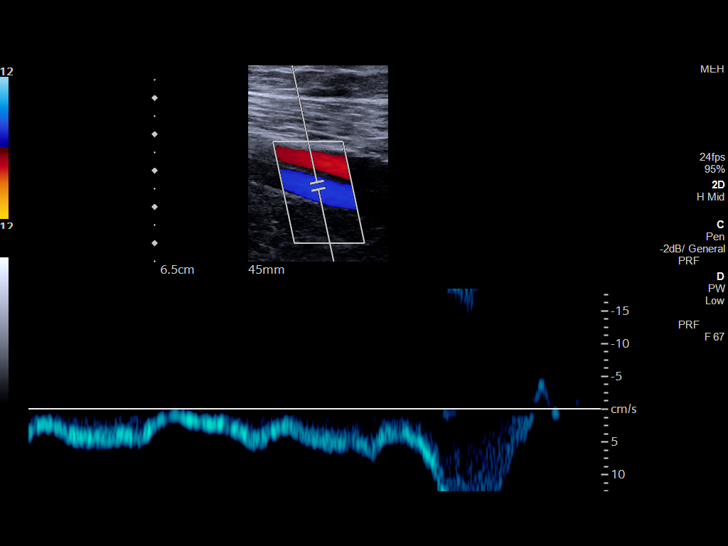
[im 18/60]
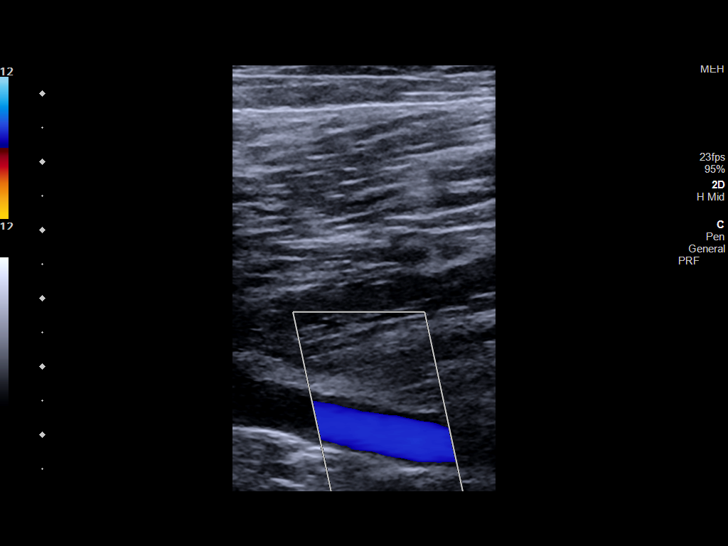
[im 24/60]
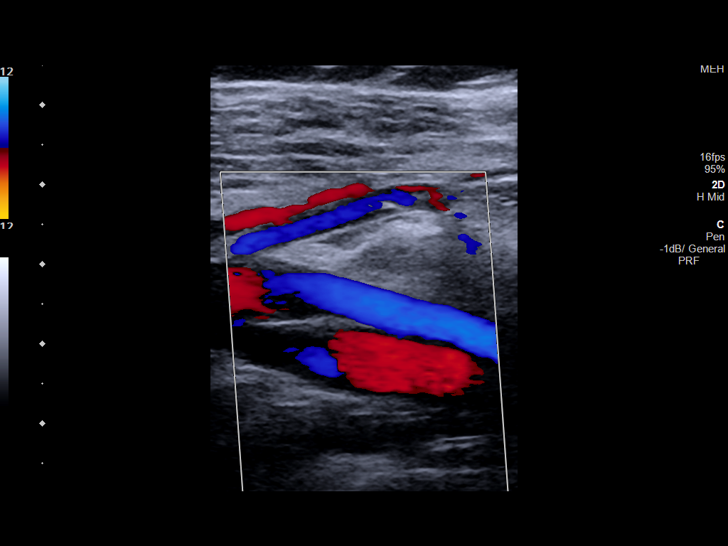
[im 29/60]
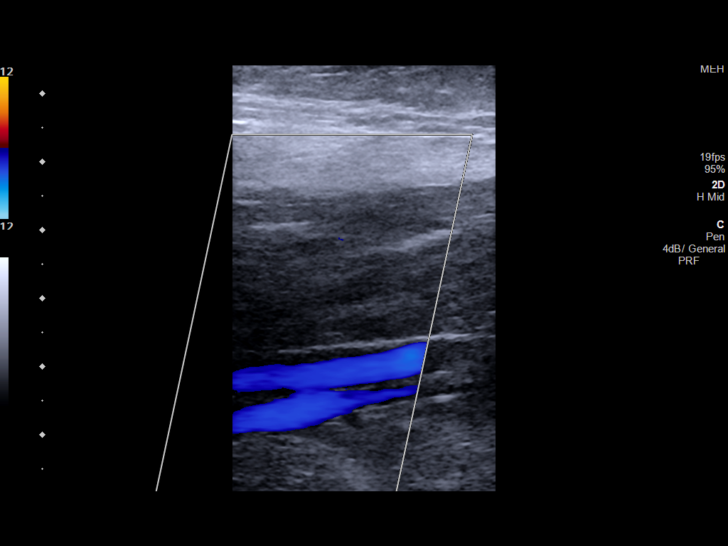
[im 31/60]
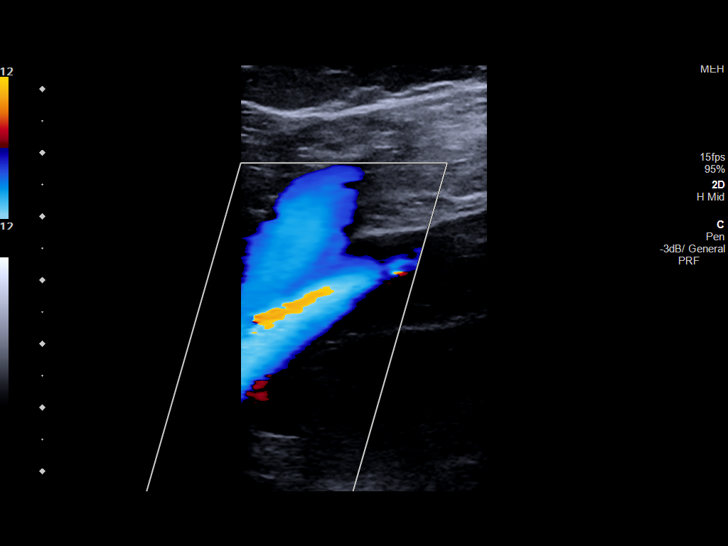
[im 36/60]
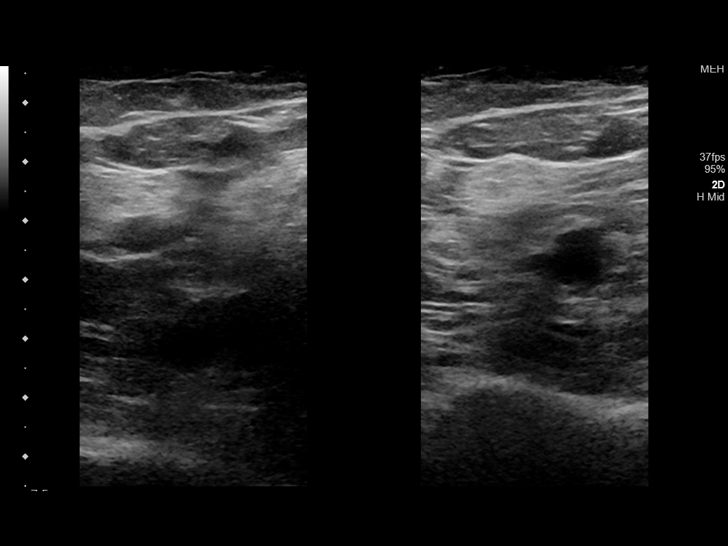
[im 42/60]
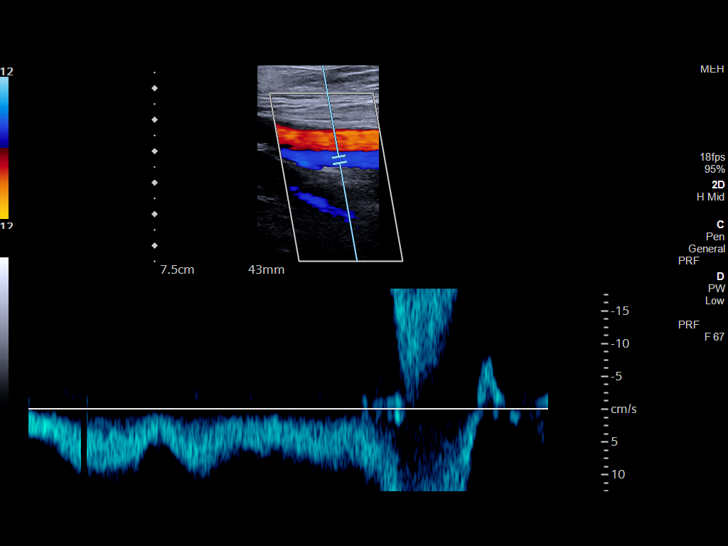
[im 47/60]
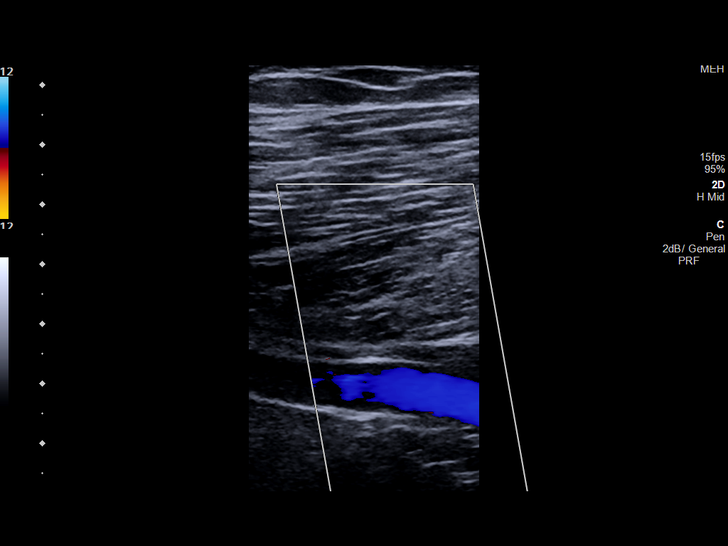
[im 49/60]
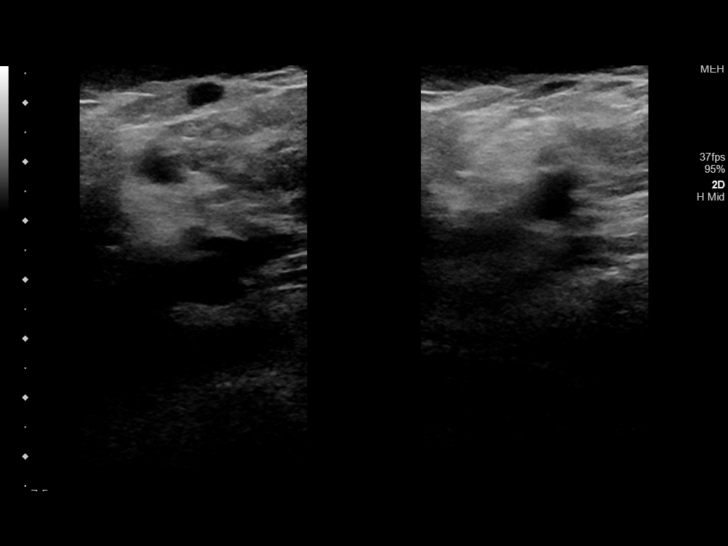
[im 54/60]
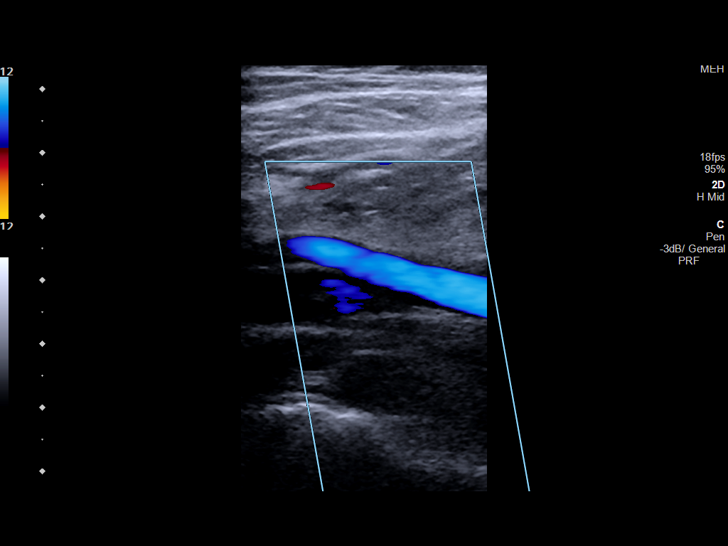
[im 60/60]
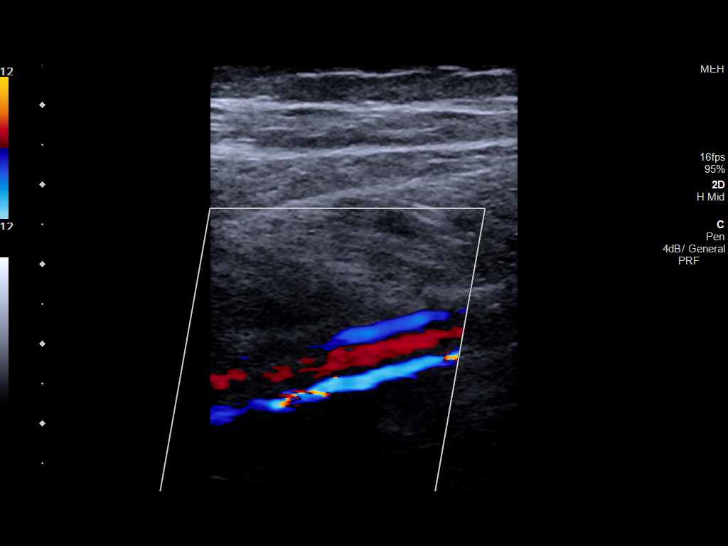

[14 of 24 positions shown; findings below may reference images not displayed]

FINDINGS: There is complete compressibility of the bilateral common femoral,
femoral, and popliteal veins. Doppler analysis demonstrates
respiratory phasicity and augmentation of flow with calf
compression. No obvious superficial vein or calf vein thrombosis.
IMPRESSION: No evidence of lower extremity DVT.

## 2021-07-31 ENCOUNTER — Other Ambulatory Visit: Payer: Self-pay

## 2021-07-31 MED ORDER — AMOXICILLIN-POT CLAVULANATE 875-125 MG PO TABS
ORAL_TABLET | ORAL | 0 refills | Status: AC
  Filled 2021-07-31: qty 14, 7d supply, fill #0
